# Patient Record
Sex: Female | Born: 1995 | Race: White | Hispanic: No | Marital: Single | Smoking: Never smoker
Health system: Southern US, Community
[De-identification: ages and names within clinical notes are randomized; demographics above are authoritative.]

## PROBLEM LIST (undated history)

## (undated) DIAGNOSIS — R1013 Epigastric pain: Secondary | ICD-10-CM

## (undated) DIAGNOSIS — F329 Major depressive disorder, single episode, unspecified: Secondary | ICD-10-CM

## (undated) DIAGNOSIS — F32A Depression, unspecified: Secondary | ICD-10-CM

## (undated) DIAGNOSIS — R109 Unspecified abdominal pain: Secondary | ICD-10-CM

## (undated) DIAGNOSIS — R946 Abnormal results of thyroid function studies: Secondary | ICD-10-CM

## (undated) DIAGNOSIS — D509 Iron deficiency anemia, unspecified: Secondary | ICD-10-CM

## (undated) DIAGNOSIS — E063 Autoimmune thyroiditis: Secondary | ICD-10-CM

## (undated) DIAGNOSIS — F419 Anxiety disorder, unspecified: Secondary | ICD-10-CM

## (undated) DIAGNOSIS — K219 Gastro-esophageal reflux disease without esophagitis: Secondary | ICD-10-CM

## (undated) HISTORY — DX: Epigastric pain: R10.13

## (undated) HISTORY — DX: Unspecified abdominal pain: R10.9

## (undated) HISTORY — DX: Anxiety disorder, unspecified: F41.9

## (undated) HISTORY — DX: Autoimmune thyroiditis: E06.3

## (undated) HISTORY — DX: Depression, unspecified: F32.A

## (undated) HISTORY — DX: Iron deficiency anemia, unspecified: D50.9

## (undated) HISTORY — DX: Abnormal results of thyroid function studies: R94.6

## (undated) HISTORY — DX: Gastro-esophageal reflux disease without esophagitis: K21.9

## (undated) HISTORY — DX: Major depressive disorder, single episode, unspecified: F32.9

## (undated) HISTORY — PX: MOLE REMOVAL: SHX2046

## (undated) HISTORY — PX: FRENULECTOMY, LINGUAL: SHX1681

---
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
## 2015-05-13 ENCOUNTER — Encounter: Payer: Self-pay | Admitting: Obstetrics and Gynecology

## 2015-05-13 ENCOUNTER — Ambulatory Visit (INDEPENDENT_AMBULATORY_CARE_PROVIDER_SITE_OTHER): Payer: Managed Care, Other (non HMO) | Admitting: Obstetrics and Gynecology

## 2015-05-13 VITALS — BP 148/98 | HR 112 | Resp 16 | Ht 68.75 in | Wt 193.0 lb

## 2015-05-13 DIAGNOSIS — Z01419 Encounter for gynecological examination (general) (routine) without abnormal findings: Secondary | ICD-10-CM | POA: Diagnosis not present

## 2015-05-13 DIAGNOSIS — R102 Pelvic and perineal pain: Secondary | ICD-10-CM | POA: Diagnosis not present

## 2015-05-13 DIAGNOSIS — R030 Elevated blood-pressure reading, without diagnosis of hypertension: Secondary | ICD-10-CM | POA: Diagnosis not present

## 2015-05-13 DIAGNOSIS — Z113 Encounter for screening for infections with a predominantly sexual mode of transmission: Secondary | ICD-10-CM

## 2015-05-13 DIAGNOSIS — IMO0001 Reserved for inherently not codable concepts without codable children: Secondary | ICD-10-CM

## 2015-05-13 NOTE — Patient Instructions (Addendum)
EXERCISE AND DIET:  We recommended that you start or continue a regular exercise program for good health. Regular exercise means any activity that makes your heart beat faster and makes you sweat.  We recommend exercising at least 30 minutes per day at least 3 days a week, preferably 4 or 5.  We also recommend a diet low in fat and sugar.  Inactivity, poor dietary choices and obesity can cause diabetes, heart attack, stroke, and kidney damage, among others.    ALCOHOL AND SMOKING:  Women should limit their alcohol intake to no more than 7 drinks/beers/glasses of wine (combined, not each!) per week. Moderation of alcohol intake to this level decreases your risk of breast cancer and liver damage. And of course, no recreational drugs are part of a healthy lifestyle.  And absolutely no smoking or even second hand smoke. Most people know smoking can cause heart and lung diseases, but did you know it also contributes to weakening of your bones? Aging of your skin?  Yellowing of your teeth and nails?  CALCIUM AND VITAMIN D:  Adequate intake of calcium and Vitamin D are recommended.  The recommendations for exact amounts of these supplements seem to change often, but generally speaking 600 mg of calcium (either carbonate or citrate) and 800 units of Vitamin D per day seems prudent. Certain women may benefit from higher intake of Vitamin D.  If you are among these women, your doctor will have told you during your visit.    PAP SMEARS:  Pap smears, to check for cervical cancer or precancers,  have traditionally been done yearly, although recent scientific advances have shown that most women can have pap smears less often.  However, every woman still should have a physical exam from her gynecologist every year. It will include a breast check, inspection of the vulva and vagina to check for abnormal growths or skin changes, a visual exam of the cervix, and then an exam to evaluate the size and shape of the uterus and  ovaries.  And after 21 years of age, a rectal exam is indicated to check for rectal cancers. We will also provide age appropriate advice regarding health maintenance, like when you should have certain vaccines, screening for sexually transmitted diseases, bone density testing, colonoscopy, mammograms, etc.    Pelvic Ultrasound tomorrow at [facility]: [address] [place], [place] [address]   [phone]:  Drink 32 oz of water 1 hour prior tp appt. time and hold bladder. DO NOT VOID- Bladder must be full at time of study.

## 2015-05-13 NOTE — Progress Notes (Signed)
Patient ID: Patricia Cooke, female   DOB: October 09, 1995, 20 y.o.   MRN: [id number] 20 y.o. [id number] SingleCaucasianF here for annual exam. Patient c/o pelvic pain. She c/o intermittent pelvic pain since she was 14. It gets worse when she stands for a while. It has been occuring every other day for the last 3 weeks, but on her feet. Typically occurs 2-3 x a months. Feels like menstrual cramps, gnawing pain, BLQ, can last for hours. Gets better when she rests. Slightly different than her cycle cramps, both radiate to her lower back. The pain ranges from a 2-8/10 in severity. No nausea, vomiting, diarrhea, constipation or urinary symptoms with the pain. She thinks she has IBS, can go from diarrhea to constipation, has some associated pains, feels different.  Cramps with her cycle are tolerable on OCP's, she takes some ibuprofen which helps. She can saturate a pad in 6 hours. No BTB. She has been sexually active x 1 last year, used condoms.  Period Cycle (Days): 28 Period Duration (Days): 4- 5 days  Period Pattern: Regular Menstrual Flow: Moderate Menstrual Control: Maxi pad Dysmenorrhea: (!) Moderate Dysmenorrhea Symptoms: Cramping  Patient's last menstrual period was 04/29/2015.          Sexually active: No. not currently The current method of family planning is OCP (estrogen/progesterone).    Exercising: Yes.    walking, gym, weights and elliptical  Smoker:  no  Health Maintenance: Pap:  Never History of abnormal Pap:  N/A TDaP:  2008 Gardasil: completed all 3    reports that she has never smoked. She has never used smokeless tobacco. 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 She wants to be a physician. Dad is an Actor in the area. Mom is a pediatrician.   Past Medical History  Diagnosis Date  . Anxiety   . Borderline abnormal TFTs   . Thyroiditis, autoimmune   .  Anemia, iron deficiency   . GERD (gastroesophageal reflux disease)   . Dyspepsia   . Abdominal pain, other specified site   . Depression   Depression/anxiety are under control with medication  Past Surgical History  Procedure Laterality Date  . Frenulectomy, lingual    . Mole removal      Current Outpatient Prescriptions  Medication Sig Dispense Refill  . albuterol (PROVENTIL HFA;VENTOLIN HFA) 108 (90 BASE) MCG/ACT inhaler Inhale 2 puffs into the lungs every 6 (six) hours as needed for wheezing. 1 Inhaler 2  . amitriptyline (ELAVIL) 50 MG tablet Take 1 tablet (50 mg total) by mouth at bedtime. 90 tablet 2  . butalbital-acetaminophen-caffeine (FIORICET, ESGIC) 50-325-40 MG per tablet 1 tab daily as needed for headache 30 tablet 0  . desogestrel-ethinyl estradiol (VELIVET,CAZIANT,CESIA,CYCLESSA) 0.1/0.125/0.15 -0.025 MG tablet Take 1 tablet by mouth daily.    Marland Kitchen escitalopram (LEXAPRO) 20 MG tablet Take 1 tablet (20 mg total) by mouth daily. 90 tablet 2  . buPROPion (WELLBUTRIN XL) 150 MG 24 hr tablet Take 150 mg by mouth every morning.  5  . clonazePAM (KLONOPIN) 0.5 MG tablet TAKE 1 & 1/2 TO 2 TABLETS AT BEDTIME FOR SLEEP  0   No current facility-administered medications for this visit.    Family History  Problem Relation Age of Onset  . Thyroid disease Mother     Hashimoto's disease  . Thyroid disease Brother     Hashimoto's Dz  . Thyroid disease Maternal Aunt  Hashimoto's Dz  . Thyroid disease Maternal Uncle     Hashimoto's Dz  . Thyroid disease Paternal Aunt     Hashimoto's Dz  . Hyperlipidemia Paternal Aunt   . Hypertension Paternal Aunt   . Heart disease Paternal Aunt   . Hypertension Paternal Uncle   . Hyperlipidemia Paternal Uncle   . Thyroid disease Maternal Grandmother     Hypothyroid prior to Thyroid surgery  . Thyroid disease Paternal Grandmother     Hashimoto's Dz  . Diabetes Paternal Grandmother     T2DM  . Hyperlipidemia Paternal Grandmother   .  Diabetes Paternal Grandfather     T2DM  . Hyperlipidemia Paternal Grandfather   . Diabetes Other     PGGF had T1DM.    Review of Systems  Constitutional: Negative.   HENT: Negative.   Eyes: Negative.   Respiratory: Negative.   Cardiovascular: Negative.   Gastrointestinal: Negative.   Endocrine: Negative.   Genitourinary: Negative.        Pelvic pain   Musculoskeletal: Negative.   Skin: Negative.   Allergic/Immunologic: Negative.   Neurological: Negative.   Psychiatric/Behavioral: Negative.     Exam:   BP 148/98 mmHg  Pulse 112  Resp 16  Ht 5' 8.75" (1.746 m)  Wt 193 lb (87.544 kg)  BMI 28.72 kg/m2  LMP 04/29/2015  Weight change: @ Height:   Height: 5' 8.75" (174.6 cm)  Ht Readings from Last 3 Encounters:  05/13/15 5' 8.75" (1.746 m)  02/26/13  (1.753 m) (97 %*, Z = 1.88)  07/26/12  (1.753 m) (97 %*, Z = 1.89)   * Growth percentiles are based on CDC 2-20 Years data.    General appearance: alert, cooperative and appears stated age Head: Normocephalic, without obvious abnormality, atraumatic Neck: no adenopathy, supple, symmetrical, trachea midline and thyroid normal to inspection and palpation Lungs: clear to auscultation bilaterally Breasts: normal appearance, no masses or tenderness Heart: regular rate and rhythm Abdomen: soft, non-tender; bowel sounds normal; no masses,  no organomegaly Extremities: extremities normal, atraumatic, no cyanosis or edema Skin: Skin color, texture, turgor normal. No rashes or lesions Lymph nodes: Cervical, supraclavicular, and axillary nodes normal. No abnormal inguinal nodes palpated Neurologic: Grossly normal   Pelvic: External genitalia:  no lesions              Urethra:  normal appearing urethra with no masses, tenderness or lesions              Bartholins and Skenes: normal                 Vagina: normal appearing vagina with normal color and discharge, no lesions              Cervix: no lesions   Pelvic floor: not tender               Bimanual Exam:  Uterus:  normal size, contour, position, consistency, mobility, non-tender              Adnexa: no mass, fullness, tenderness               Rectovaginal: Confirms               Anus:  normal sphincter tone, no lesions  Chaperone was present for exam.  A:  Well Woman with normal exam  Contraception, not currently sexually active  Dysmenorrhea, controlled on OCP's  Pelvic pain  Elevated BP x 2, patient states she gets nervous even with  the BP cuff, when checked at home it runs in the 120's/80's  P:   No pap  STD testing  Pelvic ultrasound  Discussed breast self exam   Discussed calcium and vit D  She will have her parents check her BP tonight and will monitor it at school and call in results  Discussed weight loss, watching salt intake  If her BP remains elevated would recommend she come off of OCP's, discussed micronor and the mirena (the mirena would be particularly helpful because of her cramps)

## 2015-05-13 NOTE — Progress Notes (Signed)
Pelvic Ultrasound tomorrow at [facility]: [address] [place], [place] [address] [phone]: 05/14/15 at 2:45 Drink 32 oz of water 1 hour prior tp appt. time and hold bladder. DO NOT VOID- Bladder must be full at time of study.

## 2015-05-14 ENCOUNTER — Telehealth: Payer: Self-pay | Admitting: Obstetrics and Gynecology

## 2015-05-14 ENCOUNTER — Ambulatory Visit (HOSPITAL_COMMUNITY)
Admission: RE | Admit: 2015-05-14 | Discharge: 2015-05-14 | Disposition: A | Discharge status: Home / Self Care | Payer: 59 | Source: Ambulatory Visit | Attending: Obstetrics and Gynecology | Admitting: Obstetrics and Gynecology

## 2015-05-14 DIAGNOSIS — R102 Pelvic and perineal pain: Secondary | ICD-10-CM | POA: Diagnosis not present

## 2015-05-14 LAB — STD PANEL
HIV 1&2 Ab, 4th Generation: NONREACTIVE
Hepatitis B Surface Ag: NEGATIVE

## 2015-05-14 LAB — GC/CHLAMYDIA PROBE AMP
CT PROBE, AMP APTIMA: NOT DETECTED
GC Probe RNA: NOT DETECTED

## 2015-05-14 NOTE — Telephone Encounter (Signed)
Per OV note from 05/13/2015 patient was to have her parents record her BP over night and call with update. Information to Dr.Jertson for review and advise.

## 2015-05-14 NOTE — Telephone Encounter (Signed)
Patient calling with BP readings: Last night: 116/78 Today: 120/74  Patient says she knows why her BP was elevated, it was because she forgot to take her klonopin for the last 3 days and she thinks her BP is elevated due to that. Best # to reach: [phone]

## 2015-05-15 ENCOUNTER — Encounter: Payer: 59 | Admitting: Obstetrics and Gynecology

## 2015-05-15 ENCOUNTER — Telehealth: Payer: Self-pay | Admitting: Obstetrics and Gynecology

## 2015-05-15 NOTE — Telephone Encounter (Signed)
I called patient's home #, she wasn't home. I had a long conversation with her Mother (patient consented), discussed normal u/s and labs.  Discussed that her u/s wasn't consistent with pelvic congestion syndrome (although can have a false negative with patient lying down). The only thing I can offer her is suppression, ie with Lupron for 3 months to see if she improves. The pain is coming irrespective of her cycle on OCP's.  The patient has seen ortho, didn't feel her pain was MS, she has seen her primary. She has multiple GI c/o, hasn't seen GI yet.  Her mother told me that the patient was suffering from severe depression and anxiety last fall. Her mother had to go be near her at college so she would stay at college. She is doing better, but still gets very anxious and worries about her health. Her mom checked her BP at home (she is a pediatrician) and her BP was between 115-120/75-80. I will try calling the patient later today or in the morning. She is currently traveling.

## 2015-05-16 ENCOUNTER — Telehealth: Payer: Self-pay | Admitting: Obstetrics and Gynecology

## 2015-05-16 DIAGNOSIS — R197 Diarrhea, unspecified: Secondary | ICD-10-CM

## 2015-05-16 DIAGNOSIS — K5909 Other constipation: Secondary | ICD-10-CM

## 2015-05-16 DIAGNOSIS — R109 Unspecified abdominal pain: Secondary | ICD-10-CM

## 2015-05-16 NOTE — Telephone Encounter (Signed)
Patient returning call. She may be hard to reach this afternoon but will be available tomorrow.

## 2015-05-16 NOTE — Telephone Encounter (Signed)
Called patient on her cell, left a message for her to call.

## 2015-05-16 NOTE — Telephone Encounter (Signed)
Please let the patient know that I'm not in the office tomorrow, but am back on Monday. I extensively spoke with her mother yesterday (okay per patients form). Her ultrasound was normal, no signs of pelvic congestions (although false negative is possible), her normal pelvic exam (not tender) also makes it less likely. I think she should f/u with GI. If she wants we could try 3 months of lupron to see if this made her pain go away. She should also f/u with her primary again. Pain sounds muscular skeletal.  Please explain all of this and she can call back on Monday or I can try to call her.

## 2015-05-17 NOTE — Telephone Encounter (Signed)
Spoke with patient. Advised of message as seen below from Dr.Jertson. Patient verbalizes understanding. She will contact her PCP for follow up. She will need a referral to a GI as the Gastroenterologist I she saw previously was a Holiday representative. Referral to Dr.Mann placed. Patient would like to speak with Dr.Mann's office directly to schedule this due to her school schedule. Patient will return call on Monday to speak with Dr.Jertson.

## 2015-05-20 NOTE — Telephone Encounter (Signed)
Patient will be available today and tomorrow after 12:00 pm.

## 2015-05-20 NOTE — Telephone Encounter (Signed)
Called the patient, left a message that I would call tomorrow

## 2015-05-21 NOTE — Telephone Encounter (Signed)
Spoke with the patient, reviewed u/s and discussion with her mother. She understands that she isn't classic for pelvic congestion syndrome, but she could have it.  She is on meds for anxiety and depression. Ortho doesn't think her pain in [place]. 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 We discussed the option of a trial of lupron, she would like to do this. Will do the 3 month lupron shot when she is home on spring break with add pack progesterone therapy.  Please order lupron 11.25 mg IM x 1 with 1 refill. She will do this when she is home for spring break. She will need aygestin 5 mg po qd #90 with 1 refill to take with the lupron.

## 2015-05-23 ENCOUNTER — Telehealth: Payer: Self-pay | Admitting: Emergency Medicine

## 2015-05-23 MED ORDER — NORETHINDRONE ACETATE 5 MG PO TABS: ORAL_TABLET | ORAL | Status: DC

## 2015-05-23 MED ORDER — LEUPROLIDE ACETATE (3 MONTH) 11.25 MG IM KIT: 11.2500 mg | PACK | INTRAMUSCULAR | Status: DC

## 2015-05-23 NOTE — Telephone Encounter (Signed)
Lupron and Aygestin ordered.  Lupron sent for pre-certification.  Please see open telephone encounter for 05/23/15.

## 2015-05-23 NOTE — Telephone Encounter (Signed)
Patient returned call. She is advised that Lupron order has been sent to [facility] for pre certification.   Advised order of Aygestin 5 mg po daily is sent to [facility] locally, she is advised not to start until she starts Lupron.   Patient plans on starting Lupron when she is in town for Spring Break which she states is end of March.   Patient asks to review with Dr. Oscar La if she will need to stop birth control while on Lupron.  Also, can patient have Lupron injection at any time of cycle if she will only be in town for one week?

## 2015-05-23 NOTE — Addendum Note (Signed)
Addended by: Joeseph Amor on: 05/23/2015 01:52 PM   Modules accepted: Orders

## 2015-05-23 NOTE — Telephone Encounter (Signed)
Call to patient regarding Lupron order.  Aygestin 5 mg po daily with Lupron sent to local [facility].  Calling patient to advise, prescription requested to be held until patient ready to pick up.

## 2015-06-10 NOTE — Telephone Encounter (Signed)
Patient's mother Rosalita Chessman is calling on behalf of patient regarding Lupron injection. Dpr on file to talk with mother.

## 2015-06-11 NOTE — Telephone Encounter (Signed)
Call to patient's Mother mobile as per designated party release form. Voicemail box full and unable to leave message.

## 2015-06-12 NOTE — Telephone Encounter (Signed)
Patient's mom returning a call to [facility]

## 2015-06-13 NOTE — Telephone Encounter (Signed)
Spoke with patients Mother, Rosalita Chessman. She states that patient has additional questions regarding Lupron and would like to talk with Dr. Oscar La further about it before proceeding when she is in town.  Consult scheduled for 07/08/15.  Routing to provider for final review. Patient agreeable to disposition. Will close encounter.

## 2015-06-24 NOTE — Telephone Encounter (Signed)
Dr. Oscar LaJertson if you have reviewed this and no further follow up need may I close encounter?

## 2015-07-03 ENCOUNTER — Telehealth: Payer: Self-pay | Admitting: Obstetrics and Gynecology

## 2015-07-03 NOTE — Telephone Encounter (Signed)
Left message on home voicemail to call and reschedule cancelled appointment. Cell phone mailbox is full.

## 2015-07-04 ENCOUNTER — Telehealth: Payer: Self-pay | Admitting: Obstetrics and Gynecology

## 2015-07-04 NOTE — Telephone Encounter (Signed)
Call to [facility].   They advised that patient has not returned phone calls for welcome or discussing Lupron.  Advised to please hold any phone calls to patient until she has her consult with Dr. Oscar LaJertson on 07/09/15. She will decide at this time if she plans to move forward with Lupron.  [facility] representative states that they will wait to hear from patient or our office regarding medication.   Routing to provider for final review. Patient agreeable to disposition. Will close encounter.

## 2015-07-04 NOTE — Telephone Encounter (Signed)
Patricia Cooke with [facility] calling regarding patient's lupron. She can be reached at [phone] ext [phone].

## 2015-07-08 ENCOUNTER — Ambulatory Visit: Payer: Managed Care, Other (non HMO) | Admitting: Obstetrics and Gynecology

## 2015-07-09 ENCOUNTER — Ambulatory Visit (INDEPENDENT_AMBULATORY_CARE_PROVIDER_SITE_OTHER): Payer: Managed Care, Other (non HMO) | Admitting: Obstetrics and Gynecology

## 2015-07-09 ENCOUNTER — Encounter: Payer: Self-pay | Admitting: Obstetrics and Gynecology

## 2015-07-09 VITALS — BP 132/84 | HR 84 | Resp 16 | Wt 182.0 lb

## 2015-07-09 DIAGNOSIS — R102 Pelvic and perineal pain: Secondary | ICD-10-CM

## 2015-07-09 NOTE — Progress Notes (Signed)
Patient ID: Patricia Cooke, female   DOB: Jan 22, 1996, 20 y.o.   MRN: [id number] GYNECOLOGY  VISIT   HPI: 20 y.o.   Single  Caucasian  female   G0P0000 with Patient's last menstrual period was 06/25/2015.   here to follow up on pelvic pain/ OCP. She was seen a few months with c/o pelvic pain when standing for long periods of time, not always. She has only had 3 episodes of pain since she was here last. The first time the pain occurred was at 20. When she is more active the pain can occur 2 x a week. No change with her cycles, no better with OCP's. Normal pelvic ultrasound. We discussed the possibility of pelvic congestion syndrome and a trial of lupron to see if that helped. She is currently in school and mostly sitting She is currently having more generalized abdominal pain and diarrhea, different than the pelvic pain she gets with standing. She saw Dr Loreta Ave this morning, starting OTC medication for IBS. This seems different than the other pain she gets.   GYNECOLOGIC HISTORY: Patient's last menstrual period was 06/25/2015. Contraception:OCP Menopausal hormone therapy: None        OB History    Gravida Para Term Preterm AB TAB SAB Ectopic Multiple Living           Patient Active Problem List   Diagnosis Date Noted  . 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 Goiter 11/02/2011  . Borderline abnormal TFTs   . Thyroiditis, autoimmune   . Anemia, iron deficiency   . GERD (gastroesophageal reflux disease)   . Dyspepsia   . Abdominal  pain, other specified site   . Anxiety 06/03/2011  . Insomnia 06/03/2011  . Depressed affect 06/03/2011  . Hypothyroid 09/08/2010    Past Medical History  Diagnosis Date  . Anxiety   . Borderline abnormal TFTs   . Thyroiditis, autoimmune   . Anemia, iron deficiency   . GERD (gastroesophageal reflux disease)   . Dyspepsia   . Abdominal pain, other specified site   . Depression     Past Surgical History  Procedure  Laterality Date  . Frenulectomy, lingual    . Mole removal      Current Outpatient Prescriptions  Medication Sig Dispense Refill  . albuterol (PROVENTIL HFA;VENTOLIN HFA) 108 (90 BASE) MCG/ACT inhaler Inhale 2 puffs into the lungs every 6 (six) hours as needed for wheezing. 1 Inhaler 2  . amitriptyline (ELAVIL) 50 MG tablet Take 1 tablet (50 mg total) by mouth at bedtime. 90 tablet 2  . buPROPion (WELLBUTRIN XL) 150 MG 24 hr tablet Take 150 mg by mouth every morning.  5  . clonazePAM (KLONOPIN) 0.5 MG tablet TAKE 1 & 1/2 TO 2 TABLETS AT BEDTIME FOR SLEEP  0  . desogestrel-ethinyl estradiol (VELIVET,CAZIANT,CESIA,CYCLESSA) 0.1/0.125/0.15 -0.025 MG tablet Take 1 tablet by mouth daily.    Marland Kitchen escitalopram (LEXAPRO) 20 MG tablet Take 1 tablet (20 mg total) by mouth daily. 90 tablet 2  . ferrous sulfate 325 (65 FE) MG tablet Take 325 mg by mouth daily with breakfast.    . omeprazole (PRILOSEC) 20 MG capsule Take 20 mg by mouth daily.     No current facility-administered medications for this visit.     ALLERGIES: Review of patient's allergies indicates no known allergies.  Family History  Problem Relation Age of Onset  . Thyroid disease Mother     Hashimoto's disease  .  Thyroid disease Brother     Hashimoto's Dz  . Thyroid disease Maternal Aunt     Hashimoto's Dz  . Thyroid disease Maternal Uncle     Hashimoto's Dz  . Thyroid disease Paternal Aunt     Hashimoto's Dz  . Hyperlipidemia Paternal Aunt   . Hypertension Paternal Aunt   . Heart disease Paternal Aunt   . Hypertension Paternal Uncle   . Hyperlipidemia Paternal Uncle   . Thyroid disease Maternal Grandmother     Hypothyroid prior to Thyroid surgery  . Thyroid disease Paternal Grandmother     Hashimoto's Dz  . Diabetes Paternal Grandmother     T2DM  . Hyperlipidemia Paternal Grandmother   . Diabetes Paternal Grandfather     T2DM  . Hyperlipidemia Paternal Grandfather   . Diabetes Other     PGGF had T1DM.    Social  History   Social History  . Marital Status: Single    Spouse Name: N/A  . Number of Children: N/A  . Years of Education: N/A   Occupational History  . Not on file.   Social History Main Topics  . Smoking status: Never Smoker   . Smokeless tobacco: Never Used  . Alcohol Use: No  . Drug Use: No  . Sexual Activity:    Partners: Male   Other Topics Concern  . Not on file   Social History Narrative    Review of Systems  Constitutional: Negative.   HENT: Negative.   Eyes: Negative.   Respiratory: Negative.   Cardiovascular: Negative.   Gastrointestinal: Negative.   Genitourinary: Negative.   Musculoskeletal: Negative.   Skin: Negative.   Neurological: Negative.   Endo/Heme/Allergies: Negative.   Psychiatric/Behavioral: Negative.     PHYSICAL EXAMINATION:    BP 140/86 mmHg  Pulse 84  Resp 16  Wt 182 lb (82.555 kg)  LMP 06/25/2015    General appearance: alert, cooperative and appears stated age  ASSESSMENT Pelvic pain with standing, prior normal pelvic exam (including pelvic floor), negative u/s, no help with OCP's. Symptoms could go along with pelvic congestion syndrome Elevated BP, h/o white coat HTN, she reports normal BP at home (parents are both MD's)    PLAN F/U BP of 132/80 Discussed the option of high dose provera  We again discussed a 3 month shot of lupron, she thinks she would like to try this in the summer when she will be up on her feet more  She would use aygestin with the lupron     An After Visit Summary was printed and given to the patient.  15 minutes face to face time of which over 50% was spent in counseling.

## 2015-10-18 ENCOUNTER — Telehealth: Payer: Self-pay | Admitting: *Deleted

## 2015-10-18 NOTE — Telephone Encounter (Signed)
Spoke with patient. Advised of message as seen below from Dr.Silva. Patient is agreeable and verbalizes understanding.  Routing to provider for final review. Patient agreeable to disposition. Will close encounter.  

## 2015-10-18 NOTE — Telephone Encounter (Signed)
Breakthrough bleeding with OCP. Patient requesting call from nurse.

## 2015-10-18 NOTE — Telephone Encounter (Signed)
Do UPT to rule out pregnancy and call back if positive.  I would just take the pills as directed and finish up this pack.  She is probably due to start the placebo pills soon.   If she has irregular bleeding with the next pack, please make an appointment for an office visit.

## 2015-10-18 NOTE — Telephone Encounter (Signed)
Spoke with patient. Patient states that she is currently taking Cyclessa and has been taking this for 5 years. Reports she had her cycle during her placebo week last month like normal. During the second week of her pill pack this month she started her menses again. Reports she in now in her third week of her pill pack and is still having bleeding like a normal cycle. Denies taking any pills late or missing any pills. Patient is concerned about bleeding and then having her cycle due again in two weeks. Denies feeling fatigued, light headed, or dizzy. Advised I will speak with covering provider and return call with further recommendations. She is agreeable.

## 2015-11-18 ENCOUNTER — Encounter: Payer: Self-pay | Admitting: Obstetrics and Gynecology

## 2015-11-18 ENCOUNTER — Ambulatory Visit (INDEPENDENT_AMBULATORY_CARE_PROVIDER_SITE_OTHER): Payer: Managed Care, Other (non HMO) | Admitting: Obstetrics and Gynecology

## 2015-11-18 VITALS — BP 142/82 | HR 88 | Resp 14 | Wt 177.0 lb

## 2015-11-18 DIAGNOSIS — R030 Elevated blood-pressure reading, without diagnosis of hypertension: Secondary | ICD-10-CM

## 2015-11-18 DIAGNOSIS — N921 Excessive and frequent menstruation with irregular cycle: Secondary | ICD-10-CM | POA: Diagnosis not present

## 2015-11-18 DIAGNOSIS — IMO0001 Reserved for inherently not codable concepts without codable children: Secondary | ICD-10-CM

## 2015-11-18 NOTE — Progress Notes (Signed)
GYNECOLOGY  VISIT   HPI: 20 y.o.   Single  Caucasian  female   G0P0000 with Patient's last menstrual period was 11/04/2015.   here c/o break through bleeding on OCP. She has had BTB for the last 2 months on OCP's. She denies late or missed pills. Last month she bleed the entire month, like a menses. She was changing her menstrual cup 2 x a day (2/3 full). Now she is on her 3rd week of pills, started bleeding the end of her first week of pills, now just light.  Not sexually active for the last year. Her pelvic pain has been better this summer. She has some menstrual like cramps, moderate. Tolerable with advil and tylenol.   GYNECOLOGIC HISTORY: Patient's last menstrual period was 11/04/2015. Contraception:OCP Menopausal hormone therapy: none        OB History    Gravida Para Term Preterm AB Living   0 0 0 0 0 0   SAB TAB Ectopic Multiple Live Births   0 0 0 0           Patient Active Problem List   Diagnosis Date Noted  . 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 Goiter 11/02/2011  . Borderline abnormal TFTs   . Thyroiditis, autoimmune   . Anemia, iron deficiency   . GERD (gastroesophageal reflux disease)   . Dyspepsia   . Abdominal  pain, other specified site   . Anxiety 06/03/2011  . Insomnia 06/03/2011  . Depressed affect 06/03/2011  . Hypothyroid 09/08/2010    Past Medical History:  Diagnosis Date  . Abdominal pain, other specified site   . Anemia, iron deficiency   . Anxiety   . Borderline abnormal TFTs   . Depression   . Dyspepsia   . GERD (gastroesophageal reflux disease)   . Thyroiditis, autoimmune     Past Surgical History:  Procedure Laterality Date  . FRENULECTOMY, LINGUAL    . MOLE REMOVAL      Current Outpatient Prescriptions  Medication Sig Dispense Refill  . amitriptyline (ELAVIL) 50 MG tablet Take 1 tablet (50 mg total) by mouth at bedtime. 90 tablet 2  . buPROPion (WELLBUTRIN XL) 150 MG 24 hr tablet Take 150 mg by mouth every morning.   5  . busPIRone (BUSPAR) 15 MG tablet Take 15 mg by mouth 2 (two) times daily.    . clonazePAM (KLONOPIN) 0.5 MG tablet TAKE 1 & 1/2 TO 2 TABLETS AT BEDTIME FOR SLEEP  0  . desogestrel-ethinyl estradiol (VELIVET,CAZIANT,CESIA,CYCLESSA) 0.1/0.125/0.15 -0.025 MG tablet Take 1 tablet by mouth daily.    Marland Kitchen escitalopram (LEXAPRO) 20 MG tablet Take 1 tablet (20 mg total) by mouth daily. 90 tablet 2  . omeprazole (PRILOSEC) 20 MG capsule Take 20 mg by mouth daily.    Marland Kitchen albuterol (PROVENTIL HFA;VENTOLIN HFA) 108 (90 BASE) MCG/ACT inhaler Inhale 2 puffs into the lungs every 6 (six) hours as needed for wheezing. 1 Inhaler 2   No current facility-administered medications for this visit.      ALLERGIES: Review of patient's allergies indicates no known allergies.  Family History  Problem Relation Age of Onset  . Thyroid disease Mother     Hashimoto's disease  . Thyroid disease Brother     Hashimoto's Dz  . Thyroid disease Maternal Aunt     Hashimoto's Dz  . Thyroid disease Maternal Uncle     Hashimoto's Dz  . Thyroid disease Paternal Aunt     Hashimoto's Dz  . Hyperlipidemia Paternal Aunt   .  Hypertension Paternal Aunt   . Heart disease Paternal Aunt   . Hypertension Paternal Uncle   . Hyperlipidemia Paternal Uncle   . Thyroid disease Maternal Grandmother     Hypothyroid prior to Thyroid surgery  . Thyroid disease Paternal Grandmother     Hashimoto's Dz  . Diabetes Paternal Grandmother     T2DM  . Hyperlipidemia Paternal Grandmother   . Diabetes Paternal Grandfather     T2DM  . Hyperlipidemia Paternal Grandfather   . Diabetes Other     PGGF had T1DM.    Social History   Social History  . Marital status: Single    Spouse name: N/A  . Number of children: N/A  . Years of education: N/A   Occupational History  . Not on file.   Social History Main Topics  . Smoking status: Never Smoker  . Smokeless tobacco: Never Used  . Alcohol use No  . Drug use: No  . Sexual activity: Not  Currently    Partners: Male   Other Topics Concern  . Not on file   Social History Narrative  . No narrative on file    Review of Systems  Constitutional: Negative.   HENT: Negative.   Eyes: Negative.   Respiratory: Negative.   Cardiovascular: Negative.   Gastrointestinal: Negative.   Genitourinary: Negative.        Break through bleeding  Musculoskeletal: Negative.   Skin: Negative.   Neurological: Negative.   Endo/Heme/Allergies: Negative.   Psychiatric/Behavioral: Negative.     PHYSICAL EXAMINATION:    BP (!) 142/82 (BP Location: Right Arm, Patient Position: Sitting, Cuff Size: Normal)   Pulse 88   Resp 14   Wt 177 lb (80.3 kg)   LMP 11/04/2015   BMI 26.33 kg/m     General appearance: alert, cooperative and appears stated age Neck: no adenopathy, supple, symmetrical, trachea midline and thyroid normal Abdomen: soft, non-tender; bowel sounds normal; no masses,  no organomegaly  Pelvic: External genitalia:  no lesions              Urethra:  normal appearing urethra with no masses, tenderness or lesions              Bartholins and Skenes: normal                 Vagina: normal appearing vagina with normal color and discharge, no lesions              Cervix: no lesions              Bimanual Exam:  Uterus:  normal size, contour, position, consistency, mobility, non-tender and anteverted              Adnexa: no mass, fullness, tenderness              Chaperone was present for exam.   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

## 2015-11-19 LAB — THYROID PANEL WITH TSH
FREE THYROXINE INDEX: 1.8 (ref 1.4–3.8)
T3 Uptake: 22 % (ref 22–35)
T4, Total: 8.3 ug/dL (ref 4.5–12.0)
TSH: 1.29 m[IU]/L

## 2015-11-19 LAB — CBC
HCT: 35.2 % (ref 35.0–45.0)
Hemoglobin: 11.2 g/dL — ABNORMAL LOW (ref 11.7–15.5)
MCH: 28.1 pg (ref 27.0–33.0)
MCHC: 31.8 g/dL — ABNORMAL LOW (ref 32.0–36.0)
MCV: 88.4 fL (ref 80.0–100.0)
MPV: 11.5 fL (ref 7.5–12.5)
PLATELETS: 195 10*3/uL (ref 140–400)
RBC: 3.98 MIL/uL (ref 3.80–5.10)
RDW: 13.8 % (ref 11.0–15.0)
WBC: 5.6 10*3/uL (ref 3.8–10.8)

## 2017-09-12 IMAGING — US US TRANSVAGINAL NON-OB
1 series · 15 of 25 positions shown · non-contrast
Comparison: None in PACs

CLINICAL DATA: Intermittent pelvic pain for the past 3 weeks,
symptoms are bilateral ; onset of last normal menstrual period
April 29, 2015

EXAM:
TRANSABDOMINAL AND TRANSVAGINAL ULTRASOUND OF PELVIS
TECHNIQUE: Both transabdominal and transvaginal ultrasound examinations of the
pelvis were performed. Transabdominal technique was performed for
global imaging of the pelvis including uterus, ovaries, adnexal
regions, and pelvic cul-de-sac. It was necessary to proceed with
endovaginal exam following the transabdominal exam to visualize the
endometrium and ovaries..

[Series 1: us transvaginal non-ob · 15 of 48 slices shown]
[im 1/48]
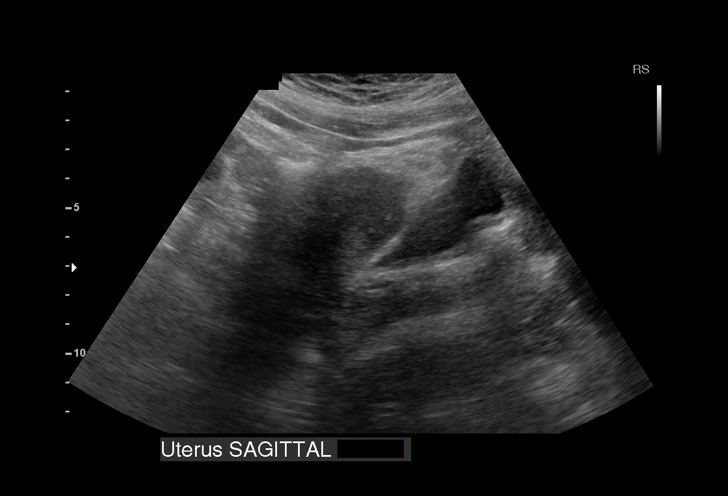
[im 4/48]
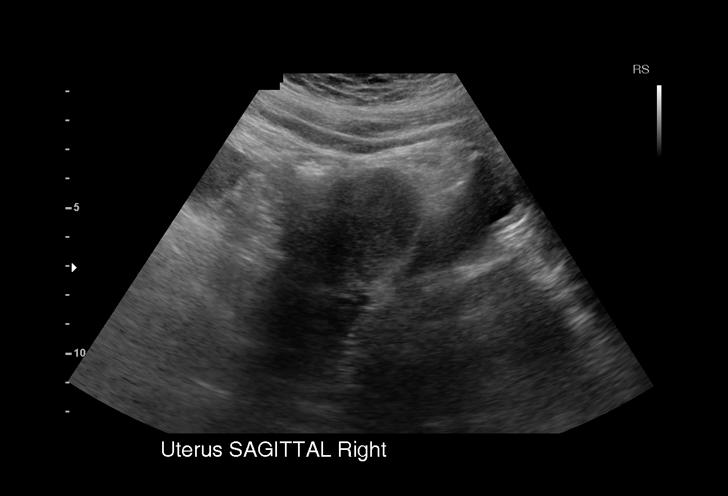
[im 8/48]
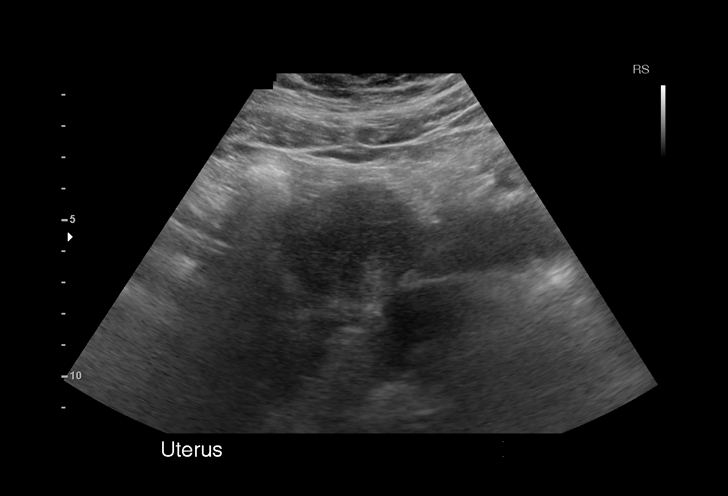
[im 10/48]
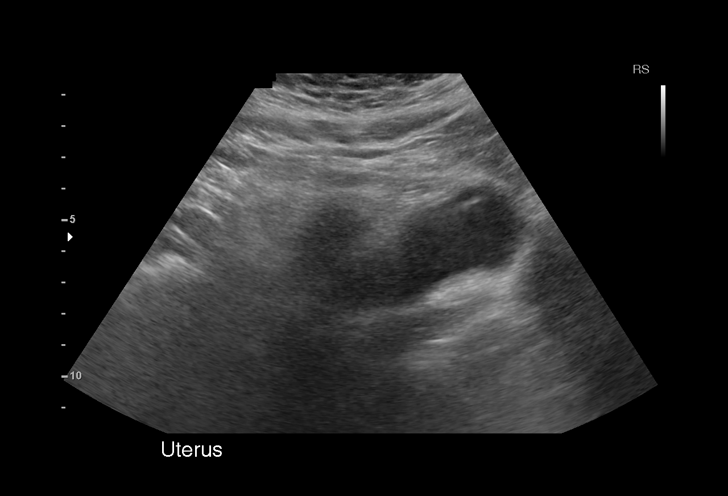
[im 14/48]
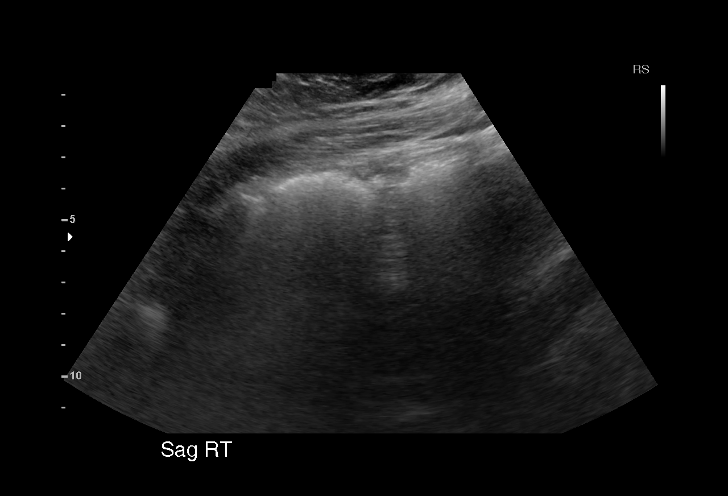
[im 18/48]
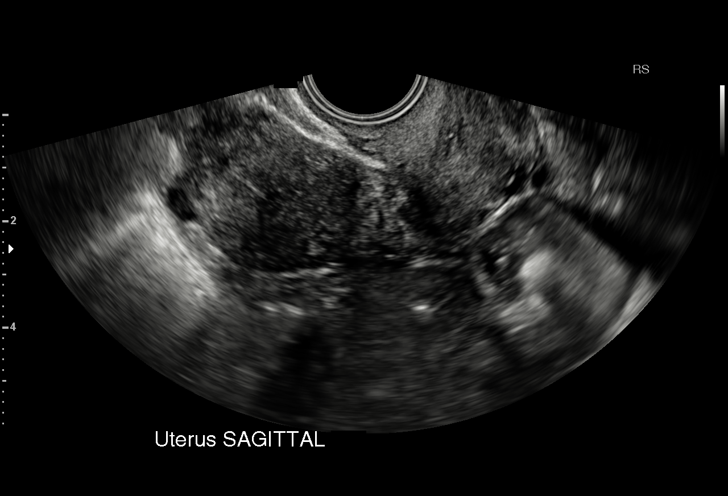
[im 20/48]
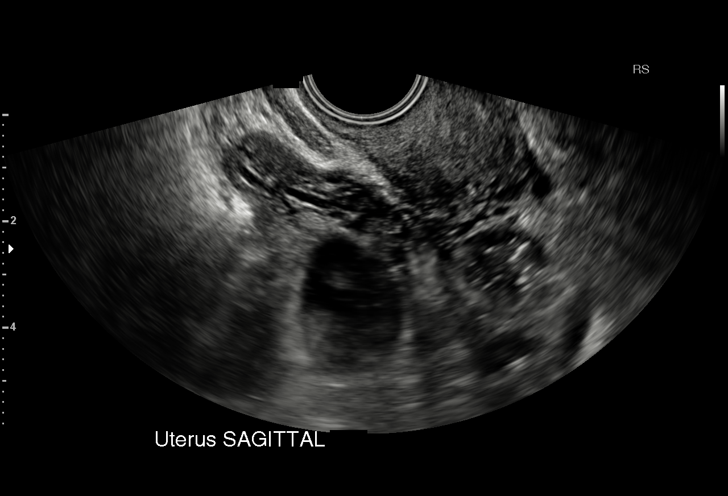
[im 24/48]
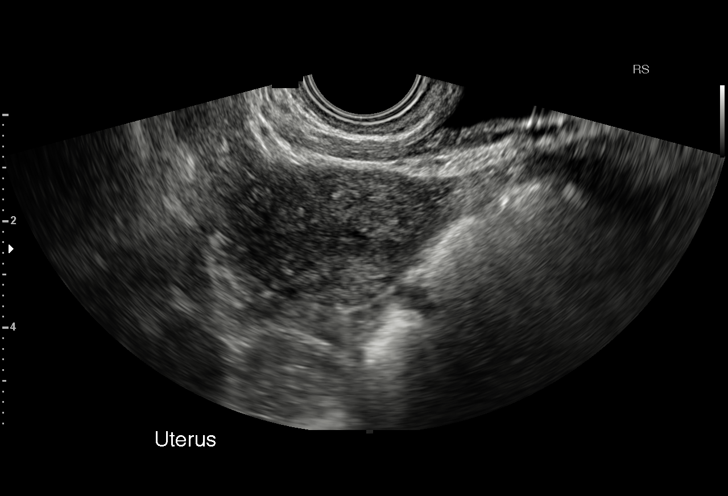
[im 28/48]
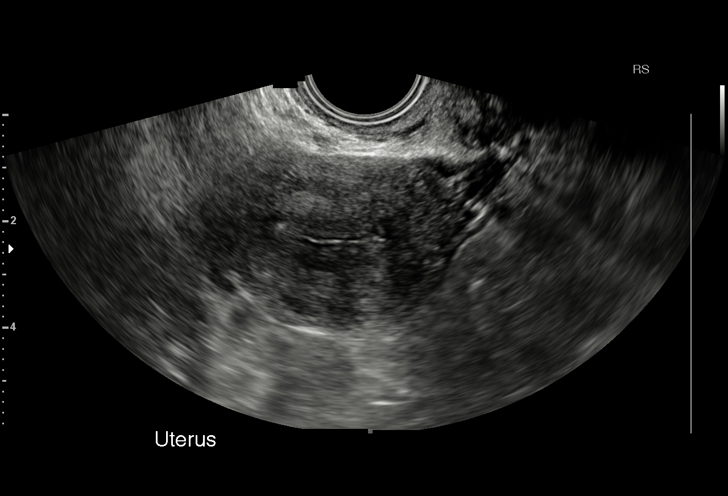
[im 30/48]
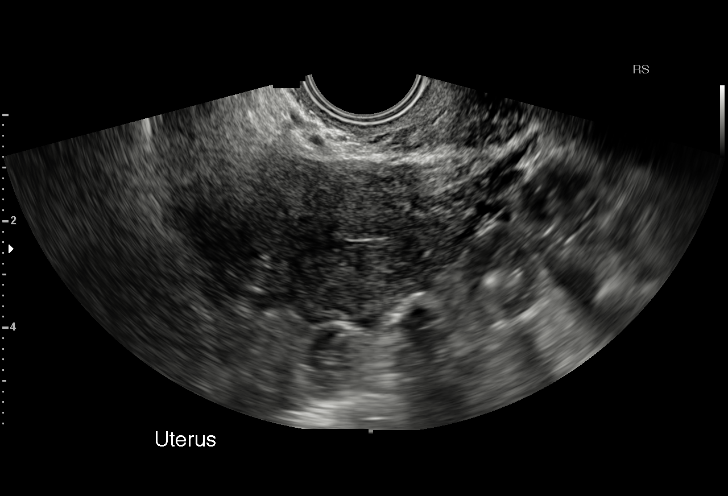
[im 34/48]
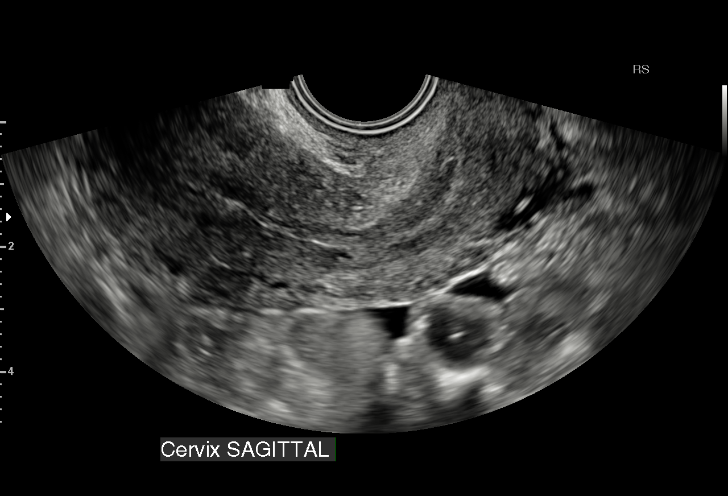
[im 38/48]
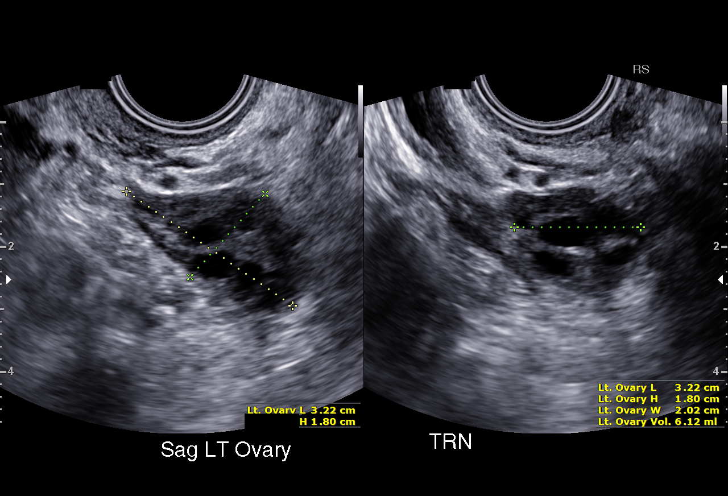
[im 40/48]
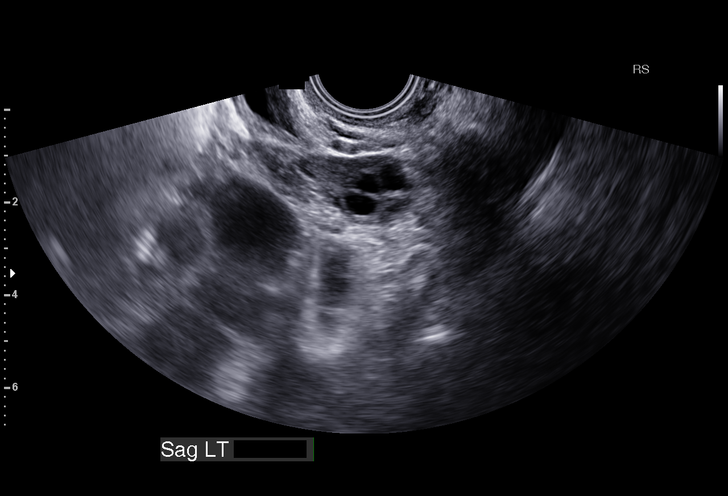
[im 44/48]
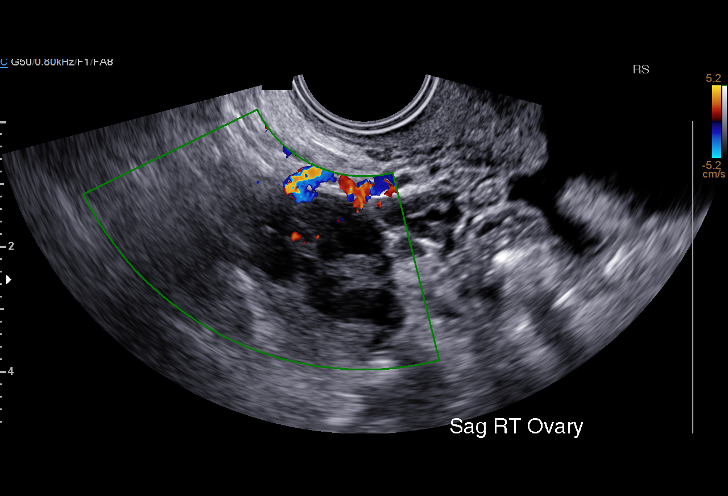
[im 48/48]
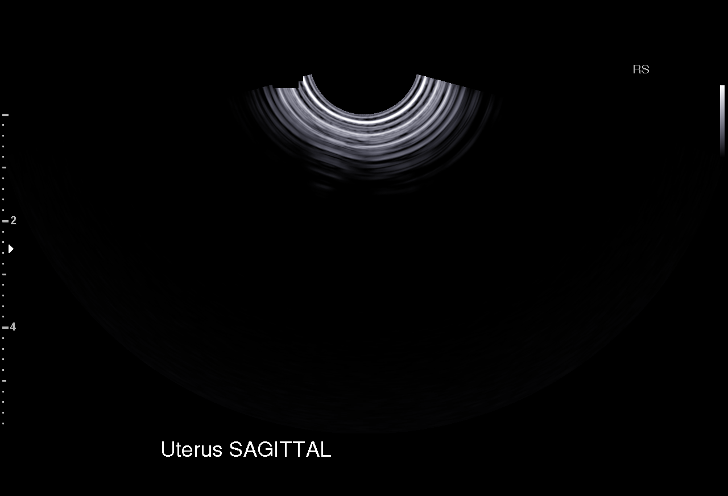

[15 of 25 positions shown; findings below may reference images not displayed]

FINDINGS: Uterus

Measurements: 7.8 x 3.4 x 4.4 cm. The uterine echotexture is mildly
heterogeneous. No discrete fibroids are demonstrated.

Endometrium

Thickness: 5.4 mm.  No focal abnormality visualized.

Right ovary

Measurements: 3.3 x 2.2 x 2.3 cm. Normal appearance/no adnexal mass.

Left ovary

Measurements: 3.2 x 1.8 x 2.0 cm. Normal appearance/no adnexal mass.

Other findings

There is no free pelvic fluid.
IMPRESSION: Normal transabdominal and transvaginal ultrasound of the pelvis.

## 2018-03-23 ENCOUNTER — Other Ambulatory Visit: Payer: Self-pay | Admitting: Otolaryngology
